# Patient Record
Sex: Male | Born: 2000 | Race: Black or African American | Hispanic: No | Marital: Single | Smoking: Current every day smoker
Health system: Southern US, Community
[De-identification: ages and names within clinical notes are randomized; demographics above are authoritative.]

---
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
## 2019-09-20 ENCOUNTER — Encounter (HOSPITAL_COMMUNITY): Payer: Self-pay

## 2019-09-20 ENCOUNTER — Other Ambulatory Visit: Payer: Self-pay

## 2019-09-20 ENCOUNTER — Emergency Department (HOSPITAL_COMMUNITY)
Admission: EM | Admit: 2019-09-20 | Discharge: 2019-09-20 | Disposition: A | Discharge status: Home / Self Care | Payer: Self-pay | Attending: Emergency Medicine | Admitting: Emergency Medicine

## 2019-09-20 DIAGNOSIS — R369 Urethral discharge, unspecified: Secondary | ICD-10-CM | POA: Insufficient documentation

## 2019-09-20 DIAGNOSIS — Z711 Person with feared health complaint in whom no diagnosis is made: Secondary | ICD-10-CM | POA: Insufficient documentation

## 2019-09-20 DIAGNOSIS — F17210 Nicotine dependence, cigarettes, uncomplicated: Secondary | ICD-10-CM | POA: Insufficient documentation

## 2019-09-20 LAB — URINALYSIS, ROUTINE W REFLEX MICROSCOPIC
Bacteria, UA: NONE SEEN
Bilirubin Urine: NEGATIVE
Glucose, UA: NEGATIVE mg/dL
Hgb urine dipstick: NEGATIVE
Ketones, ur: NEGATIVE mg/dL
Nitrite: NEGATIVE
Protein, ur: NEGATIVE mg/dL
Specific Gravity, Urine: 1.023 (ref 1.005–1.030)
WBC, UA: >50 WBC/hpf — ABNORMAL HIGH (ref 0–5)
pH: 7 (ref 5.0–8.0)

## 2019-09-20 LAB — HIV ANTIBODY (ROUTINE TESTING W REFLEX): HIV Screen 4th Generation wRfx: NONREACTIVE

## 2019-09-20 MED ORDER — CEFTRIAXONE SODIUM 500 MG IJ SOLR
500.0000 mg | Freq: Once | INTRAMUSCULAR | Status: AC
  Administered 2019-09-20: 500 mg via INTRAMUSCULAR
  Filled 2019-09-20: qty 500

## 2019-09-20 MED ORDER — DOXYCYCLINE HYCLATE 100 MG PO TABS
100.0000 mg | ORAL_TABLET | Freq: Once | ORAL | Status: AC
  Administered 2019-09-20: 19:00:00 100 mg via ORAL
  Filled 2019-09-20: qty 1

## 2019-09-20 MED ORDER — STERILE WATER FOR INJECTION IJ SOLN
INTRAMUSCULAR | Status: AC
  Administered 2019-09-20: 1 mL
  Filled 2019-09-20: qty 10

## 2019-09-20 MED ORDER — DOXYCYCLINE HYCLATE 100 MG PO CAPS: 100.0000 mg | ORAL_CAPSULE | Freq: Two times a day (BID) | ORAL | 0 refills | Status: DC

## 2019-09-20 NOTE — ED Triage Notes (Signed)
Patient complains of penile discharge x 4 days. Reports dysuria for same. NAD

## 2019-09-20 NOTE — Discharge Instructions (Signed)
Your STD testing is pending.  If positive you will need to let your partners know.  If your gonorrhea and Chlamydia testing is positive you were treated with antibiotic here in the emergency department as well as at home.  If your HIV and syphilis testing is positive you will need to follow-up outpatient

## 2019-09-20 NOTE — ED Provider Notes (Signed)
[facility] EMERGENCY DEPARTMENT Provider Note   CSN: [id number] Arrival date & time: 09/20/19  1625    History Chief Complaint  Patient presents with  . Penile Discharge   Raymond Mullen is a 19 y.o. male with no significant past medical history who presents for evaluation discharge.  Patient noticed yellow/green discharge to penis x1 day.  Last sexual intercourse 1 week ago.  Unsure if partner is having any symptoms.  No prior history of STDs.  Denies fever, chills, nausea, vomiting, abdominal pain, pain with bowel movements, testicular redness, swelling, warmth, rashes or lesions.  He is urinating without difficulty.  Denies aggravating or relieving factors  History obtained from patient and past medical record.  No interpreter is used.  HPI     History reviewed. No pertinent past medical history.  Patient Active Problem List   Diagnosis Date Noted  . Closed left ankle fracture 11/08/2016    Past Surgical History:  Procedure Laterality Date  . ORIF ANKLE FRACTURE Left 11/09/2016   Procedure: OPEN REDUCTION INTERNAL FIXATION (ORIF) ANKLE FRACTURE;  Surgeon: Renette Butters, MD;  Location: [facility];  Service: Orthopedics;  Laterality: Left;       No family history on file.  Social History   Tobacco Use  . Smoking status: Current Every Day Smoker  . Smokeless tobacco: Never Used  Substance Use Topics  . Alcohol use: No  . Drug use: Not Currently    Types: Marijuana    Home Medications Prior to Admission medications   Medication Sig Start Date End Date Taking? Authorizing Provider  doxycycline (VIBRAMYCIN) 100 MG capsule Take 1 capsule (100 mg total) by mouth 2 (two) times daily. 09/20/19   Deyja Sochacki A, PA-C    Allergies    Patient has no known allergies.  Review of Systems   Review of Systems  Constitutional: Negative.   HENT: Negative.   Respiratory: Negative.   Cardiovascular: Negative.   Gastrointestinal:  Negative.   Genitourinary: Positive for discharge and dysuria. Negative for decreased urine volume, difficulty urinating, flank pain, frequency, genital sores, hematuria, testicular pain and urgency.  Neurological: Negative.   All other systems reviewed and are negative.  Physical Exam Updated Vital Signs BP 132/84   Pulse 65   Temp 98.2 F (36.8 C) (Oral)   Resp 18   SpO2 100%   Physical Exam Vitals and nursing note reviewed. Exam conducted with a chaperone present.  Constitutional:      General: He is not in acute distress.    Appearance: He is well-developed. He is not ill-appearing, toxic-appearing or diaphoretic.  HENT:     Head: Normocephalic and atraumatic.     Nose: Nose normal.     Mouth/Throat:     Mouth: Mucous membranes are moist.     Pharynx: Oropharynx is clear.  Eyes:     Pupils: Pupils are equal, round, and reactive to light.  Cardiovascular:     Rate and Rhythm: Normal rate and regular rhythm.     Pulses: Normal pulses.     Heart sounds: Normal heart sounds.  Pulmonary:     Effort: Pulmonary effort is normal. No respiratory distress.     Breath sounds: Normal breath sounds.  Abdominal:     General: Bowel sounds are normal. There is no distension.     Palpations: Abdomen is soft.     Tenderness: There is no abdominal tenderness. There is no right CVA tenderness, left CVA tenderness, guarding  or rebound.     Hernia: No hernia is present.  Genitourinary:    Pubic Area: No rash or pubic lice.      Penis: Normal. No phimosis, paraphimosis, hypospadias, erythema, tenderness, discharge, swelling or lesions.      Testes: Normal. Cremasteric reflex is present.     Epididymis:     Right: Normal.     Left: Normal.     Comments: Yellow discharge to urethral meatus. No rashes or lesions. Musculoskeletal:        General: Normal range of motion.     Cervical back: Normal range of motion and neck supple.  Skin:    General: Skin is warm and dry.  Neurological:      Mental Status: He is alert.    ED Results / Procedures / Treatments   Labs (all labs ordered are listed, but only abnormal results are displayed) Labs Reviewed  URINALYSIS, ROUTINE W REFLEX MICROSCOPIC  RPR  HIV ANTIBODY (ROUTINE TESTING W REFLEX)  GC/CHLAMYDIA PROBE AMP ([place]) NOT AT [facility]    EKG None  Radiology No results found.  Procedures Procedures (including critical care time)  Medications Ordered in ED Medications  cefTRIAXone (ROCEPHIN) injection 500 mg (500 mg Intramuscular Given 09/20/19 1901)  doxycycline (VIBRA-TABS) tablet 100 mg (100 mg Oral Given 09/20/19 1900)  sterile water (preservative free) injection (1 mL  Given 09/20/19 1904)    ED Course  I have reviewed the triage vital signs and the nursing notes.  Pertinent labs & imaging results that were available during my care of the patient were reviewed by me and considered in my medical decision making (see chart for details).  Patient is afebrile without abdominal tenderness, abdominal pain or painful bowel movements to indicate prostatitis.  No tenderness to palpation of the testes or epididymis to suggest orchitis or epididymitis.  STD cultures obtained including HIV, syphilis, gonorrhea and chlamydia. Patient to be discharged with instructions to follow up with PCP. Discussed importance of using protection when sexually active. Pt understands that they have GC/Chlamydia cultures pending and that they will need to inform all sexual partners if results return positive. Patient has been treated prophylactically with Doxy and Rocephin.   Patient does not meet the SIRS or Sepsis criteria.  On repeat exam patient does not have a surgical abdomin and there are no peritoneal signs.  No indication of appendicitis, bowel obstruction, bowel perforation, cholecystitis, diverticulitis, renal stone. Patient discharged home with symptomatic treatment and given strict instructions for follow-up with their primary care  physician.  I have also discussed reasons to return immediately to the ER.  Patient expresses understanding and agrees with plan.  Patient does not wait for his UA results.  He will be DC home with Doxy.  Follow-up with results outpatient.  The patient has been appropriately medically screened and/or stabilized in the ED. I have low suspicion for any other emergent medical condition which would require further screening, evaluation or treatment in the ED or require inpatient management.  MDM Rules/Calculators/A&P                       Final Clinical Impression(s) / ED Diagnoses Final diagnoses:  Penile discharge  Concern about STD in male without diagnosis    Rx / DC Orders ED Discharge Orders         Ordered    doxycycline (VIBRAMYCIN) 100 MG capsule  2 times daily     09/20/19 1857  Ashyla Luth A, PA-C 09/20/19 1958    Blane Ohara, MD 09/21/19 [phone]

## 2019-09-21 LAB — RPR: RPR Ser Ql: NONREACTIVE

## 2019-09-22 LAB — GC/CHLAMYDIA PROBE AMP (~~LOC~~) NOT AT ARMC
Chlamydia: NEGATIVE
Comment: NEGATIVE
Comment: NORMAL
Neisseria Gonorrhea: POSITIVE — AB

## 2019-10-07 ENCOUNTER — Emergency Department (HOSPITAL_COMMUNITY): Payer: Self-pay

## 2019-10-07 ENCOUNTER — Encounter (HOSPITAL_COMMUNITY): Payer: Self-pay | Admitting: Emergency Medicine

## 2019-10-07 ENCOUNTER — Emergency Department (HOSPITAL_COMMUNITY)
Admission: EM | Admit: 2019-10-07 | Discharge: 2019-10-07 | Disposition: A | Discharge status: Home / Self Care | Payer: Self-pay | Attending: Emergency Medicine | Admitting: Emergency Medicine

## 2019-10-07 ENCOUNTER — Other Ambulatory Visit: Payer: Self-pay

## 2019-10-07 DIAGNOSIS — M25572 Pain in left ankle and joints of left foot: Secondary | ICD-10-CM | POA: Insufficient documentation

## 2019-10-07 DIAGNOSIS — G8929 Other chronic pain: Secondary | ICD-10-CM | POA: Insufficient documentation

## 2019-10-07 MED ORDER — ACETAMINOPHEN 325 MG PO TABS
650.0000 mg | ORAL_TABLET | Freq: Once | ORAL | Status: AC
  Administered 2019-10-07: 19:00:00 650 mg via ORAL
  Filled 2019-10-07: qty 2

## 2019-10-07 NOTE — ED Provider Notes (Signed)
Received patient as a handoff at shift change from [employer], PA-C.  Patient is 2 years s/p ORIF left ankle and presents for persistent ankle discomfort and swelling subsequent to surgery.  He states that his discomfort is predominantly on lateral malleolus and is intermittent.  Denies any new trauma.  No focal neurologic deficits.  Physical exam is entirely benign.  Plan per handoff provider was discharged with outpatient follow-up with orthopedic surgeon, Dr. Eulah Pont.  Recommendations for NSAIDs and Tylenol as needed for symptoms of pain and discomfort.  I personally reviewed plain films obtained of left ankle which demonstrate no acute osseous abnormalities that may explain patient's symptoms.  Hardware is in place and undisturbed.  Patient to follow-up outpatient with orthopedic surgeon.  Strict ED return precautions discussed with the patient.  Patient voices understanding and is agreeable to the plan.     DG Ankle Complete Left  Result Date: 10/07/2019 CLINICAL DATA:  Left ankle pain with standing.  No recent injury. EXAM: LEFT ANKLE COMPLETE - 3+ VIEW COMPARISON:  Plain films left ankle 10/19/2016. FINDINGS: There is no acute bony or joint abnormality. The patient has a healed distal fibular fracture in anatomic position and alignment with fixation hardware in place. No hardware complication is identified. Soft tissues are normal. IMPRESSION: No acute abnormality or finding to explain the patient's symptoms. Healed distal fibular fracture with fixation hardware in place. Electronically Signed   By: Drusilla Kanner M.D.   On: 10/07/2019 19:41      Lorelee New, PA-C 10/07/19 1953    Terald Sleeper, MD 10/08/19 [phone]

## 2019-10-07 NOTE — ED Triage Notes (Signed)
Pt c/o left ankle pain when standing. Denies injury/trauma.

## 2019-10-07 NOTE — Discharge Instructions (Signed)
You have been seen today for ankle pain. Please read and follow all provided instructions. Return to the emergency room for worsening condition or new concerning symptoms.    1. Medications:  Recommend tylenol and ibuprofen for pain as needed.   Continue usual home medications Take medications as prescribed. Please review all of the medicines and only take them if you do not have an allergy to them.   2. Treatment: rest, drink plenty of fluids. If you notice swelling in your leg you  should elevate it.  3. Follow Up:  Please follow up with the orthopedist Dr. Eulah Pont who operated on your ankle in 2018. Call the office to schedule follow up appointment   ?

## 2019-10-07 NOTE — ED Provider Notes (Signed)
[facility] EMERGENCY DEPARTMENT Provider Note   CSN: [id number] Arrival date & time: 10/07/19  1715     History Chief Complaint  Patient presents with  . Ankle Pain    Raymond Mullen is a 19 y.o. male with past medical history significant for ORIF left ankle in 2018 presents to emergency department today with chief complaint of left ankle pain and swelling x2 years.  Patient states he notices the pain and swelling when he stands on his feet for long periods of time.  He describes the pain as an aching and throbbing sensation.  Pain is located on the outside of his left ankle and radiates up his left leg.  He states the pain is intermittent.  He rates the pain 6 out of 10 in severity.  Has not taken any medications for symptoms prior to arrival.  He denies any new injury or trauma to the left ankle.  Also denies any fever, chills, numbness, tingling, decrease sensation or wound to left lower extremity.   History reviewed. No pertinent past medical history.  Patient Active Problem List   Diagnosis Date Noted  . Closed left ankle fracture 11/08/2016    Past Surgical History:  Procedure Laterality Date  . ORIF ANKLE FRACTURE Left 11/09/2016   Procedure: OPEN REDUCTION INTERNAL FIXATION (ORIF) ANKLE FRACTURE;  Surgeon: Sheral Apley, MD;  Location: [facility];  Service: Orthopedics;  Laterality: Left;       No family history on file.  Social History   Tobacco Use  . Smoking status: Current Every Day Smoker  . Smokeless tobacco: Never Used  Substance Use Topics  . Alcohol use: No  . Drug use: Not Currently    Types: Marijuana    Home Medications Prior to Admission medications   Medication Sig Start Date End Date Taking? Authorizing Provider  doxycycline (VIBRAMYCIN) 100 MG capsule Take 1 capsule (100 mg total) by mouth 2 (two) times daily. 09/20/19   Henderly, Britni A, PA-C    Allergies    Patient has no known allergies.  Review of  Systems   Review of Systems All other systems are reviewed and are negative for acute change except as noted in the HPI.  Physical Exam Updated Vital Signs BP 134/88 (BP Location: Right Arm)   Pulse 100   Temp 98.1 F (36.7 C) (Oral)   Resp 16   SpO2 96%   Physical Exam Vitals and nursing note reviewed.  Constitutional:      Appearance: He is well-developed. He is not ill-appearing or toxic-appearing.  HENT:     Head: Normocephalic and atraumatic.     Nose: Nose normal.  Eyes:     General: No scleral icterus.       Right eye: No discharge.        Left eye: No discharge.     Conjunctiva/sclera: Conjunctivae normal.  Neck:     Vascular: No JVD.  Cardiovascular:     Rate and Rhythm: Normal rate and regular rhythm.     Pulses: Normal pulses.          Dorsalis pedis pulses are 2+ on the right side and 2+ on the left side.     Heart sounds: Normal heart sounds.  Pulmonary:     Effort: Pulmonary effort is normal.     Breath sounds: Normal breath sounds.  Abdominal:     General: There is no distension.  Musculoskeletal:  General: Normal range of motion.     Cervical back: Normal range of motion.     Right lower leg: No edema.     Left lower leg: No edema.     Comments: Well healed surgical scar on lateral malleolus of left ankle. There is no swelling and tenderness over the lateral malleolus. No overt deformity. No tenderness over the medial aspect of the ankle. The fifth metatarsal is not tender. The ankle joint is intact without excessive opening on stressing. No break in skin. Good pedal pulse and cap refill of all toes. Wiggling toes without difficulty.  Full ROM of left hip and knee. Ambulates with steady gait.  Skin:    General: Skin is warm and dry.  Neurological:     Mental Status: He is oriented to person, place, and time.     GCS: GCS eye subscore is 4. GCS verbal subscore is 5. GCS motor subscore is 6.     Comments: Fluent speech, no facial droop.    Psychiatric:        Behavior: Behavior normal.      ED Results / Procedures / Treatments   Labs (all labs ordered are listed, but only abnormal results are displayed) Labs Reviewed - No data to display  EKG None  Radiology No results found.  Procedures Procedures (including critical care time)  Medications Ordered in ED Medications  acetaminophen (TYLENOL) tablet 650 mg (650 mg Oral Given 10/07/19 1845)    ED Course  I have reviewed the triage vital signs and the nursing notes.  Pertinent labs & imaging results that were available during my care of the patient were reviewed by me and considered in my medical decision making (see chart for details).    MDM Rules/Calculators/A&P                      History provided by patient with additional history obtained from chart review.    Patient presents to the ED with complaints of pain to the left ankle x 2 years. Exam without obvious deformity or open wounds. ROM intact. No tender to palpation of left ankle. No swelling noted. NVI distally.   Patient care transferred to G. Green PA-C  at the end of my shift pending xray. Patient presentation, ED course, and plan of care discussed with review of all pertinent labs and imaging. Please see his note for further details regarding further ED course and disposition. If x-ray is negative anticipate discharge home with recommendations of NSAIDs and Tylenol for pain as well as follow-up with orthopedic surgeon Dr. Percell Miller.   Portions of this note were generated with [employer]. Dictation errors may occur despite best attempts at proofreading.   Final Clinical Impression(s) / ED Diagnoses Final diagnoses:  Chronic pain of left ankle    Rx / DC Orders ED Discharge Orders    None       Flint Melter 10/07/19 1923    Lucrezia Starch, MD 10/08/19 2247

## 2019-10-07 NOTE — ED Notes (Signed)
Pt transported to Xray. 

## 2021-03-27 ENCOUNTER — Encounter (HOSPITAL_COMMUNITY): Payer: Self-pay | Admitting: Emergency Medicine

## 2021-03-27 ENCOUNTER — Other Ambulatory Visit: Payer: Self-pay

## 2021-03-27 ENCOUNTER — Emergency Department (HOSPITAL_COMMUNITY)
Admission: EM | Admit: 2021-03-27 | Discharge: 2021-03-27 | Disposition: A | Discharge status: Home / Self Care | Payer: Self-pay | Attending: Emergency Medicine | Admitting: Emergency Medicine

## 2021-03-27 DIAGNOSIS — N342 Other urethritis: Secondary | ICD-10-CM | POA: Insufficient documentation

## 2021-03-27 DIAGNOSIS — F17200 Nicotine dependence, unspecified, uncomplicated: Secondary | ICD-10-CM | POA: Insufficient documentation

## 2021-03-27 DIAGNOSIS — R300 Dysuria: Secondary | ICD-10-CM | POA: Insufficient documentation

## 2021-03-27 LAB — URINALYSIS, ROUTINE W REFLEX MICROSCOPIC
Bacteria, UA: NONE SEEN
Bilirubin Urine: NEGATIVE
Glucose, UA: NEGATIVE mg/dL
Hgb urine dipstick: NEGATIVE
Ketones, ur: NEGATIVE mg/dL
Nitrite: NEGATIVE
Protein, ur: NEGATIVE mg/dL
Specific Gravity, Urine: 1.023 (ref 1.005–1.030)
WBC, UA: >50 WBC/hpf — ABNORMAL HIGH (ref 0–5)
pH: 6 (ref 5.0–8.0)

## 2021-03-27 MED ORDER — STERILE WATER FOR INJECTION IJ SOLN
INTRAMUSCULAR | Status: AC
  Filled 2021-03-27: qty 10

## 2021-03-27 MED ORDER — DOXYCYCLINE HYCLATE 100 MG PO CAPS: 100.0000 mg | ORAL_CAPSULE | Freq: Two times a day (BID) | ORAL | 0 refills | Status: DC

## 2021-03-27 MED ORDER — CEFTRIAXONE SODIUM 500 MG IJ SOLR
500.0000 mg | Freq: Once | INTRAMUSCULAR | Status: AC
  Administered 2021-03-27: 13:00:00 500 mg via INTRAMUSCULAR
  Filled 2021-03-27: qty 500

## 2021-03-27 NOTE — Discharge Instructions (Signed)
Please read and follow all provided instructions.  Your diagnoses today include:  1. Urethritis     Tests performed today include: Test for gonorrhea and chlamydia.  Urine test: suggests urethral infection Vital signs. See below for your results today.   Medications:  For treatment of gonorrhea: You were treated with a rocephin (shot) today.   For treatment of chlamydia: You prescribed a prescription for doxycycline.  Home care instructions:  Read educational materials contained in this packet and follow any instructions provided.   You should tell your partners about your infection and avoid having sex for one week to allow time for the medicine to work.  Sexually transmitted disease testing also available at:  [facility] [place], [facility] [address], [place], phone [phone] or [phone]   Monday - Friday, call for an appointment  Return instructions:  Please return to the Emergency Department if you experience worsening symptoms.  Please return if you have any other emergent concerns.  Additional Information:  Your vital signs today were: BP 123/78 (BP Location: Right Arm)   Pulse 86   Temp 97.6 F (36.4 C) (Oral)   Resp 16   SpO2 98%  If your blood pressure (BP) was elevated above 135/85 this visit, please have this repeated by your doctor within one month. --------------

## 2021-03-27 NOTE — ED Triage Notes (Signed)
C/o green penile discharge and burning with urination since yesterday.

## 2021-03-27 NOTE — ED Provider Notes (Signed)
[facility] EMERGENCY DEPARTMENT Provider Note   CSN: [id number] Arrival date & time: 03/27/21  1057     History Chief Complaint  Patient presents with   Penile Discharge    Raymond Mullen is a 20 y.o. male.  Patient presents to the emergency department for evaluation of penile discharge and dysuria.  Symptoms started about 2 days ago.  He reports unprotected sexual intercourse with a partner about 2 weeks ago.  No sore throat or other symptoms reported.  No fevers or sores.  History of STI.        History reviewed. No pertinent past medical history.  Patient Active Problem List   Diagnosis Date Noted   Closed left ankle fracture 11/08/2016    Past Surgical History:  Procedure Laterality Date   ORIF ANKLE FRACTURE Left 11/09/2016   Procedure: OPEN REDUCTION INTERNAL FIXATION (ORIF) ANKLE FRACTURE;  Surgeon: Sheral Apley, MD;  Location: [facility];  Service: Orthopedics;  Laterality: Left;       No family history on file.  Social History   Tobacco Use   Smoking status: Every Day   Smokeless tobacco: Never  Substance Use Topics   Alcohol use: No   Drug use: Not Currently    Types: Marijuana    Home Medications Prior to Admission medications   Medication Sig Start Date End Date Taking? Authorizing Provider  doxycycline (VIBRAMYCIN) 100 MG capsule Take 1 capsule (100 mg total) by mouth 2 (two) times daily. 03/27/21  Yes Renne Crigler, PA-C    Allergies    Patient has no known allergies.  Review of Systems   Review of Systems  Constitutional:  Negative for fever.  HENT:  Negative for sore throat.   Eyes:  Negative for discharge.  Gastrointestinal:  Negative for rectal pain.  Genitourinary:  Positive for dysuria and penile discharge. Negative for frequency, genital sores, penile pain and testicular pain.  Musculoskeletal:  Negative for arthralgias.  Skin:  Negative for rash.  Hematological:  Negative for adenopathy.    Physical Exam Updated Vital Signs BP 123/78 (BP Location: Right Arm)   Pulse 86   Temp 97.6 F (36.4 C) (Oral)   Resp 16   SpO2 98%   Physical Exam Vitals and nursing note reviewed. Exam conducted with a chaperone present Chiropodist).  Constitutional:      Appearance: He is well-developed.  HENT:     Head: Normocephalic and atraumatic.  Eyes:     Conjunctiva/sclera: Conjunctivae normal.  Pulmonary:     Effort: No respiratory distress.  Genitourinary:    Penis: Discharge (Scant yellow) present.      Testes: Normal.  Musculoskeletal:     Cervical back: Normal range of motion and neck supple.  Skin:    General: Skin is warm and dry.  Neurological:     Mental Status: He is alert.    ED Results / Procedures / Treatments   Labs (all labs ordered are listed, but only abnormal results are displayed) Labs Reviewed  URINALYSIS, ROUTINE W REFLEX MICROSCOPIC - Abnormal; Notable for the following components:      Result Value   APPearance HAZY (*)    Leukocytes,Ua LARGE (*)    WBC, UA >50 (*)    All other components within normal limits  GC/CHLAMYDIA PROBE AMP ([place]) NOT AT [facility]    EKG None  Radiology No results found.  Procedures Procedures   Medications Ordered in ED Medications  cefTRIAXone (ROCEPHIN) injection  500 mg (500 mg Intramuscular Given 03/27/21 1241)  sterile water (preservative free) injection (  Given 03/27/21 1241)    ED Course  I have reviewed the triage vital signs and the nursing notes.  Pertinent labs & imaging results that were available during my care of the patient were reviewed by me and considered in my medical decision making (see chart for details).  Patient seen and examined.  Patient was placed in a hallway bed.  He agreed to be examined in the hallway restroom.  Chaperone present.  Discussed treatment for gonorrhea/chlamydia.  Offered and discussed doxycycline versus azithromycin.  Patient states that he "does not care"  when he gets treated with.  Prescription given for doxycycline due to increased efficacy.  Vital signs reviewed and are as follows: BP 123/78 (BP Location: Right Arm)   Pulse 86   Temp 97.6 F (36.4 C) (Oral)   Resp 16   SpO2 98%  12:55 PM   Will test and treat for STD exposure. Patient counseled on safe sexual practices. Told them that they should not have sexual contact for next 7 days and that they need to inform sexual partners so that they can get tested and treated as well. Patient verbalizes understanding and agrees with plan.       MDM Rules/Calculators/A&P                           Patient here with signs and symptoms consistent with urethritis.  Tested for gonorrhea and chlamydia.  Treatment as above.   Final Clinical Impression(s) / ED Diagnoses Final diagnoses:  Urethritis    Rx / DC Orders ED Discharge Orders          Ordered    doxycycline (VIBRAMYCIN) 100 MG capsule  2 times daily        03/27/21 1233             Renne Crigler, PA-C 03/27/21 1256    Derwood Kaplan, MD 03/29/21 1728

## 2021-03-27 NOTE — ED Provider Notes (Signed)
Emergency Medicine Provider Triage Evaluation Note  JAEVON PARAS , a 20 y.o. male  was evaluated in triage.  Pt complains of penile discharge, pain with urination. Unprotected sex with partner without known STD status ~2 weeks ago. Denies anal or oral sex. No fevers / chills.  Review of Systems  Positive: Penile discharge Negative: No testicle pain  Physical Exam  BP 123/78 (BP Location: Right Arm)   Pulse 86   Temp 97.6 F (36.4 C) (Oral)   Resp 16   SpO2 98%  Gen:   Awake, no distress   Resp:  Normal effort  MSK:   Moves extremities without difficulty  Other:  No testicular deformity, pain or swelling, yellowish discharge was expressed from tip of penis  Medical Decision Making  Medically screening exam initiated at 11:30 AM.  Appropriate orders placed.  KYAIR DITOMMASO was informed that the remainder of the evaluation will be completed by another provider, this initial triage assessment does not replace that evaluation, and the importance of remaining in the ED until their evaluation is complete.  STD, penile discharge   [place] 03/27/21 1131    Tegeler, Canary Brim, MD 03/27/21 1400

## 2021-03-28 LAB — GC/CHLAMYDIA PROBE AMP (~~LOC~~) NOT AT ARMC
Chlamydia: NEGATIVE
Comment: NEGATIVE
Comment: NORMAL
Neisseria Gonorrhea: POSITIVE — AB

## 2022-03-24 IMAGING — DX DG ANKLE COMPLETE 3+V*L*
3 series · 3 of 3 positions shown · non-contrast
Comparison: Plain films left ankle 10/19/2016.

CLINICAL DATA: Left ankle pain with standing.  No recent injury.

EXAM:
LEFT ANKLE COMPLETE - 3+ VIEW

[x ankle ap left]
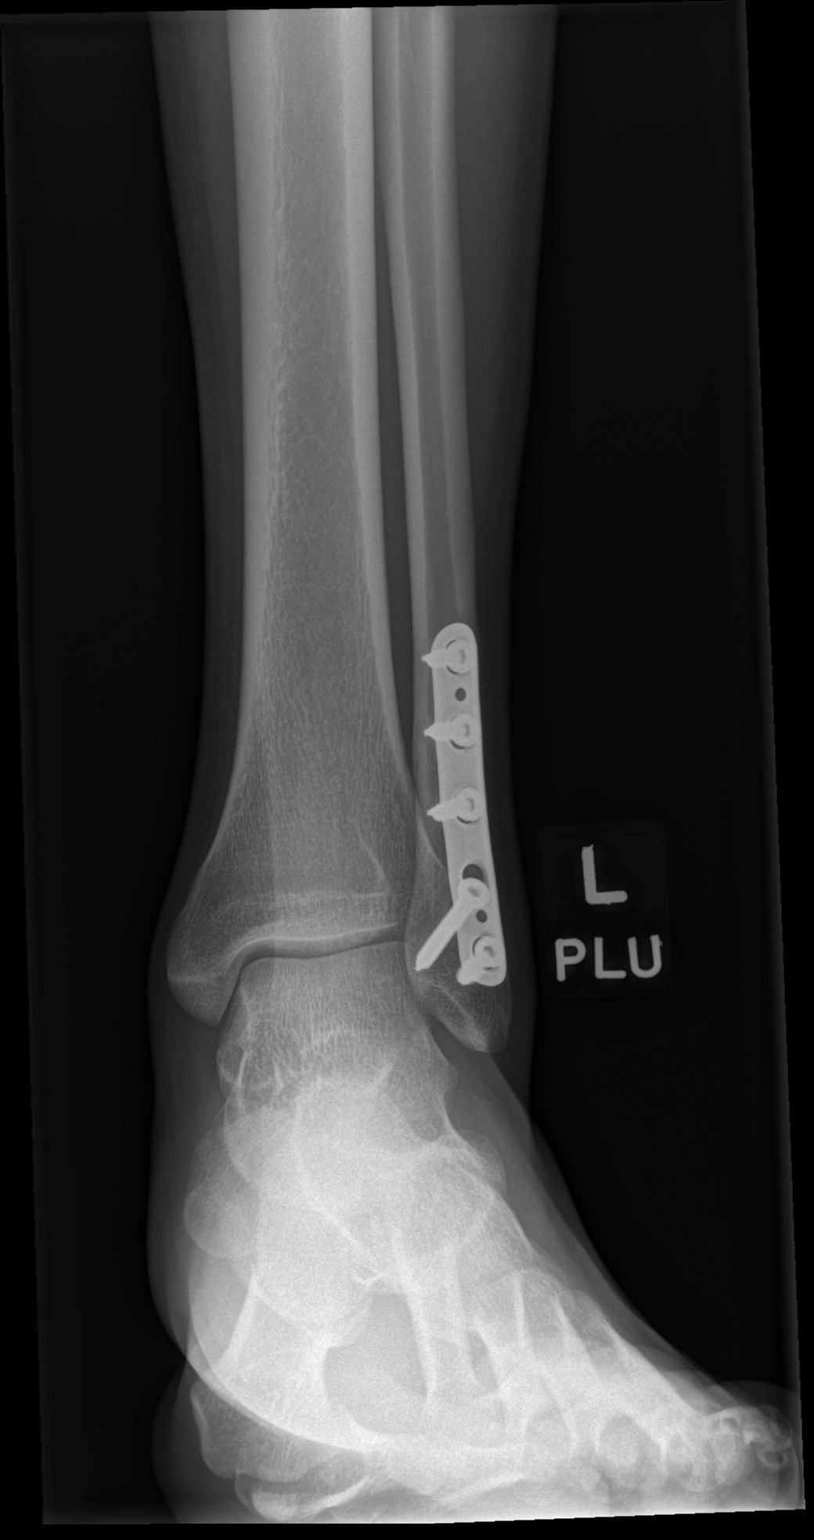

[x ankle obl left]
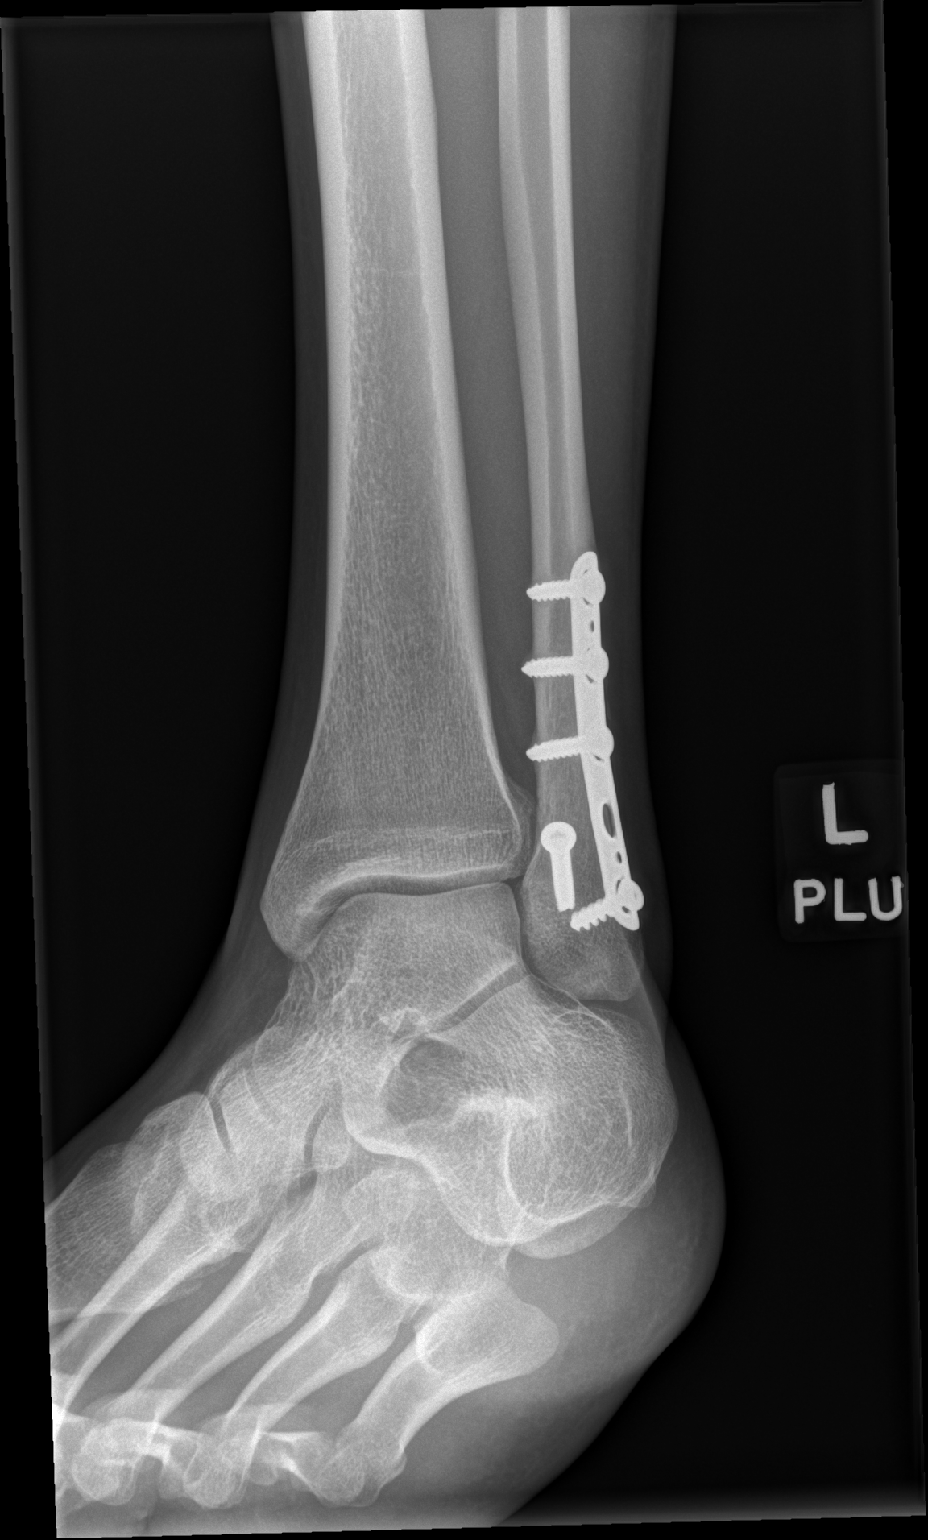

[x ankle lat left]
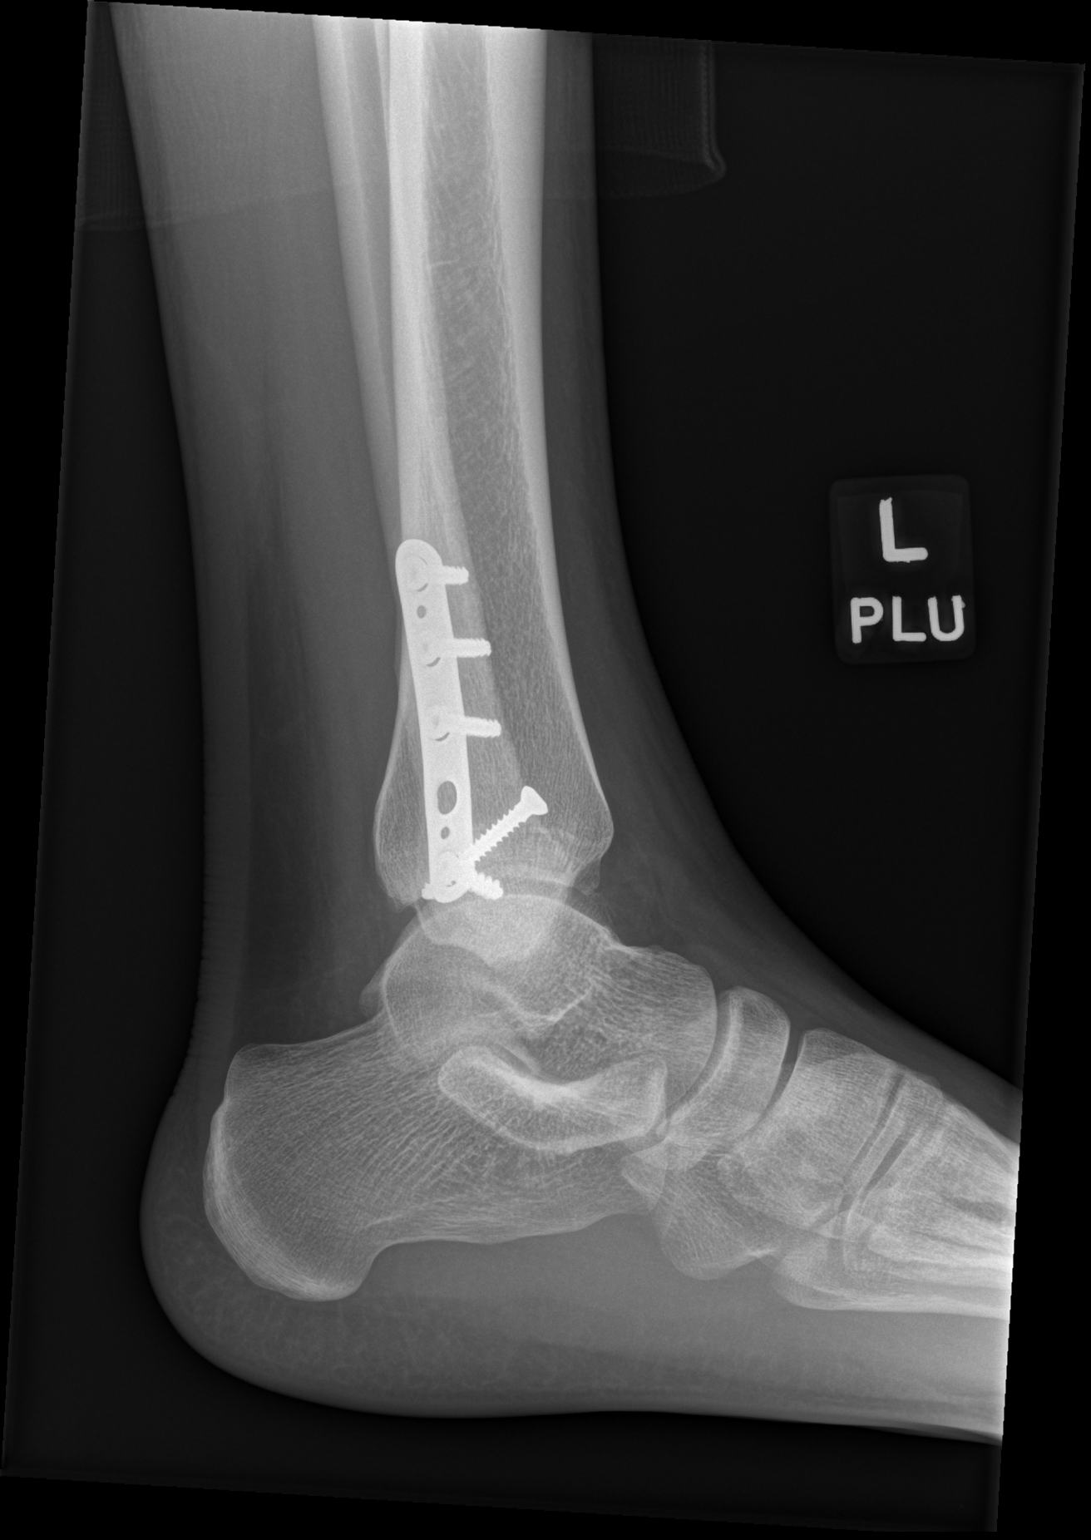

[3 of 3 positions shown; findings below may reference images not displayed]

FINDINGS: There is no acute bony or joint abnormality. The patient has a
healed distal fibular fracture in anatomic position and alignment
with fixation hardware in place. No hardware complication is
identified. Soft tissues are normal.
IMPRESSION: No acute abnormality or finding to explain the patient's symptoms.

Healed distal fibular fracture with fixation hardware in place.

## 2022-03-31 ENCOUNTER — Encounter (HOSPITAL_COMMUNITY): Payer: Self-pay

## 2022-03-31 ENCOUNTER — Other Ambulatory Visit: Payer: Self-pay

## 2022-03-31 ENCOUNTER — Emergency Department (HOSPITAL_COMMUNITY)
Admission: EM | Admit: 2022-03-31 | Discharge: 2022-03-31 | Disposition: A | Discharge status: Home / Self Care | Payer: Self-pay | Attending: Medical | Admitting: Medical

## 2022-03-31 DIAGNOSIS — Z113 Encounter for screening for infections with a predominantly sexual mode of transmission: Secondary | ICD-10-CM | POA: Insufficient documentation

## 2022-03-31 DIAGNOSIS — R369 Urethral discharge, unspecified: Secondary | ICD-10-CM | POA: Insufficient documentation

## 2022-03-31 LAB — URINALYSIS, ROUTINE W REFLEX MICROSCOPIC
Bilirubin Urine: NEGATIVE
Glucose, UA: NEGATIVE mg/dL
Hgb urine dipstick: NEGATIVE
Ketones, ur: NEGATIVE mg/dL
Nitrite: NEGATIVE
Protein, ur: NEGATIVE mg/dL
Specific Gravity, Urine: 1.021 (ref 1.005–1.030)
pH: 7 (ref 5.0–8.0)

## 2022-03-31 MED ORDER — DOXYCYCLINE HYCLATE 100 MG PO TABS
100.0000 mg | ORAL_TABLET | Freq: Once | ORAL | Status: AC
  Administered 2022-03-31: 100 mg via ORAL
  Filled 2022-03-31: qty 1

## 2022-03-31 MED ORDER — DOXYCYCLINE HYCLATE 100 MG PO CAPS: 100.0000 mg | ORAL_CAPSULE | Freq: Two times a day (BID) | ORAL | 0 refills | Status: DC

## 2022-03-31 MED ORDER — LIDOCAINE HCL (PF) 1 % IJ SOLN
1.0000 mL | Freq: Once | INTRAMUSCULAR | Status: AC
  Administered 2022-03-31: 2 mL
  Filled 2022-03-31: qty 5

## 2022-03-31 MED ORDER — CEFTRIAXONE SODIUM 500 MG IJ SOLR
500.0000 mg | Freq: Once | INTRAMUSCULAR | Status: AC
  Administered 2022-03-31: 500 mg via INTRAMUSCULAR
  Filled 2022-03-31: qty 500

## 2022-03-31 NOTE — ED Provider Triage Note (Signed)
Emergency Medicine Provider Triage Evaluation Note  ZAKARIYYA HELFMAN , a 21 y.o. male  was evaluated in triage.  Pt complains of green penile dischargex2 days. Reports dysuria. No urinary frequency or urgency. No fever, chills.   Review of Systems  Positive: penile discharge  Negative: rectal pain, testicular pain  Physical Exam  BP 119/67 (BP Location: Right Arm)   Pulse 81   Temp 98.4 F (36.9 C) (Oral)   Resp 18   Ht 5\' 7"  (1.702 m)   Wt 57.6 kg   SpO2 98%   BMI 19.89 kg/m  Gen:   Awake, no distress   Resp:  Normal effort  MSK:   Moves extremities without difficulty   Medical Decision Making  Medically screening exam initiated at 2:37 PM.  Appropriate orders placed.  KEIMON BASALDUA was informed that the remainder of the evaluation will be completed by another provider, this initial triage assessment does not replace that evaluation, and the importance of remaining in the ED until their evaluation is complete.   Osvaldo Shipper, [place] 03/31/22 1440

## 2022-03-31 NOTE — ED Triage Notes (Signed)
Pt arrived POV from home c/o some penile discomfort and greenish discharge x2 days.

## 2022-03-31 NOTE — Discharge Instructions (Addendum)
Please follow-up with your primary care doctor.  Take your doxycycline as prescribed.  Do not have sex until you complete your course, you will receive a call if he does positive for gonorrhea or chlamydia.  You have declined HIV and syphilis testing today, please follow-up with your PCP.

## 2022-03-31 NOTE — ED Provider Notes (Signed)
[facility] Provider Note   CSN: [id number] Arrival date & time: 03/31/22  1319     History  Chief Complaint  Patient presents with   Penile Discharge    Raymond Mullen is a 21 y.o. male, who presents to the ED secondary to green discharge for the last 2 days.  Reports dysuria.  No urinary frequency or urgency.  Denies any fevers or chills last sexually active 2 months ago.  Declines HIV syphilis testing.       Home Medications Prior to Admission medications   Medication Sig Start Date End Date Taking? Authorizing Provider  doxycycline (VIBRAMYCIN) 100 MG capsule Take 1 capsule (100 mg total) by mouth 2 (two) times daily. 03/31/22  Yes Marisa Hage L, PA      Allergies    Patient has no known allergies.    Review of Systems   Review of Systems  Constitutional:  Negative for chills and fever.  Genitourinary:  Positive for dysuria and penile discharge. Negative for flank pain, frequency and genital sores.    Physical Exam Updated Vital Signs BP 119/67 (BP Location: Right Arm)   Pulse 81   Temp 98.4 F (36.9 C) (Oral)   Resp 18   Ht 5\' 7"  (1.702 m)   Wt 57.6 kg   SpO2 98%   BMI 19.89 kg/m  Physical Exam Vitals and nursing note reviewed.  Constitutional:      General: He is not in acute distress.    Appearance: He is well-developed.  HENT:     Head: Normocephalic and atraumatic.  Eyes:     Conjunctiva/sclera: Conjunctivae normal.  Cardiovascular:     Rate and Rhythm: Normal rate and regular rhythm.     Heart sounds: No murmur heard. Pulmonary:     Effort: Pulmonary effort is normal. No respiratory distress.     Breath sounds: Normal breath sounds.  Abdominal:     Palpations: Abdomen is soft.     Tenderness: There is no abdominal tenderness. There is no right CVA tenderness or left CVA tenderness.  Genitourinary:    Comments: Pt declined Musculoskeletal:        General: No swelling.     Cervical back: Neck supple.   Skin:    General: Skin is warm and dry.     Capillary Refill: Capillary refill takes less than 2 seconds.  Neurological:     Mental Status: He is alert.  Psychiatric:        Mood and Affect: Mood normal.     ED Results / Procedures / Treatments   Labs (all labs ordered are listed, but only abnormal results are displayed) Labs Reviewed  URINALYSIS, ROUTINE W REFLEX MICROSCOPIC  GC/CHLAMYDIA PROBE AMP ([place]) NOT AT [facility]    EKG None  Radiology No results found.  Procedures Procedures   Medications Ordered in ED Medications  cefTRIAXone (ROCEPHIN) injection 500 mg (has no administration in time range)  lidocaine (PF) (XYLOCAINE) 1 % injection 1-2.1 mL (has no administration in time range)  doxycycline (VIBRA-TABS) tablet 100 mg (has no administration in time range)    ED Course/ Medical Decision Making/ A&P                           Medical Decision Making Amount and/or Complexity of Data Reviewed Labs: ordered.  Risk Prescription drug management.   Final Clinical Impression(s) / ED Diagnoses Final diagnoses:  Penile discharge  Screening examination for STD (sexually transmitted disease)    Rx / DC Orders ED Discharge Orders          Ordered    doxycycline (VIBRAMYCIN) 100 MG capsule  2 times daily        03/31/22 1443              Clarabell Matsuoka, [place], [place] 03/31/22 1448    Wynetta Fines, MD 04/01/22 1308

## 2023-11-22 ENCOUNTER — Emergency Department (HOSPITAL_COMMUNITY)

## 2023-11-22 ENCOUNTER — Encounter (HOSPITAL_COMMUNITY): Payer: Self-pay

## 2023-11-22 ENCOUNTER — Other Ambulatory Visit: Payer: Self-pay

## 2023-11-22 ENCOUNTER — Emergency Department (HOSPITAL_COMMUNITY)
Admission: EM | Admit: 2023-11-22 | Discharge: 2023-11-22 | Disposition: A | Discharge status: Home / Self Care | Attending: Emergency Medicine | Admitting: Emergency Medicine

## 2023-11-22 DIAGNOSIS — W2209XA Striking against other stationary object, initial encounter: Secondary | ICD-10-CM | POA: Insufficient documentation

## 2023-11-22 DIAGNOSIS — S6991XA Unspecified injury of right wrist, hand and finger(s), initial encounter: Secondary | ICD-10-CM | POA: Diagnosis present

## 2023-11-22 DIAGNOSIS — S62344A Nondisplaced fracture of base of fourth metacarpal bone, right hand, initial encounter for closed fracture: Secondary | ICD-10-CM | POA: Insufficient documentation

## 2023-11-22 MED ORDER — HYDROCODONE-ACETAMINOPHEN 5-325 MG PO TABS
1.0000 | ORAL_TABLET | Freq: Once | ORAL | Status: AC
  Administered 2023-11-22: 1 via ORAL
  Filled 2023-11-22: qty 1

## 2023-11-22 MED ORDER — HYDROCODONE-ACETAMINOPHEN 5-325 MG PO TABS: 1.0000 | ORAL_TABLET | Freq: Four times a day (QID) | ORAL | 0 refills | Status: DC | PRN

## 2023-11-22 NOTE — ED Triage Notes (Signed)
 Pt here for right hand pain that started last Sunday. Pt states it was swollen and is getting better. Denies recent trauma. Pt states he punched a wall with that hand.

## 2023-11-22 NOTE — ED Provider Notes (Signed)
 [facility] EMERGENCY DEPARTMENT AT [facility] Provider Note   CSN: [id number] Arrival date & time: 11/22/23  9058     Patient presents with: Hand Pain   Raymond Mullen is a 23 y.o. male.   HPI   22 year old right-hand-dominant male presents emergency department with right hand pain.  Patient states couple days ago on Sunday he punched a wall.  Since then he has been having pain at that site.  Was not evaluated initially.  Denies any numbness or tingling.  No other injury admitted.  Prior to Admission medications   Medication Sig Start Date End Date Taking? Authorizing Provider  HYDROcodone -acetaminophen  (NORCO/VICODIN) 5-325 MG tablet Take 1 tablet by mouth every 6 (six) hours as needed. 11/22/23  Yes Ingram Onnen, Roxie HERO, DO  doxycycline  (VIBRAMYCIN ) 100 MG capsule Take 1 capsule (100 mg total) by mouth 2 (two) times daily. 03/31/22   Small, Brooke L, PA    Allergies: Patient has no known allergies.    Review of Systems  Respiratory:  Negative for shortness of breath.   Cardiovascular:  Negative for chest pain.  Musculoskeletal:        + Right hand pain  Neurological:  Negative for weakness and numbness.    Updated Vital Signs BP 135/89 (BP Location: Right Arm)   Pulse 99   Temp 98.5 F (36.9 C)   Resp 16   Ht 5' 8 (1.727 m)   Wt 58.1 kg   SpO2 98%   BMI 19.46 kg/m   Physical Exam Vitals and nursing note reviewed.  Constitutional:      General: He is not in acute distress.    Appearance: Normal appearance.  HENT:     Head: Normocephalic.     Mouth/Throat:     Mouth: Mucous membranes are moist.   Cardiovascular:     Rate and Rhythm: Normal rate.  Pulmonary:     Effort: Pulmonary effort is normal. No respiratory distress.   Musculoskeletal:     Comments: Mild edema at the base of the metacarpals with mild tenderness to palpation, the hand is otherwise neurovascularly intact, forearm and elbow unremarkable.   Skin:    General: Skin is warm.    Neurological:     Mental Status: He is alert and oriented to person, place, and time. Mental status is at baseline.   Psychiatric:        Mood and Affect: Mood normal.     (all labs ordered are listed, but only abnormal results are displayed) Labs Reviewed - No data to display  EKG: None  Radiology: DG Hand Complete Right Result Date: 11/22/2023 CLINICAL DATA:  Pain and swelling.?  Fall. EXAM: RIGHT HAND - COMPLETE 3+ VIEW COMPARISON:  None Available. FINDINGS: There is age indeterminate mildly displaced fracture of the base of the fourth metacarpal with intra-articular extension. However, the fracture lines do not appear sharp on these views and therefore findings may represent subacute versus acute fracture. Correlate clinically with history and for point tenderness. No other acute fracture or dislocation. No aggressive osseous lesion. No significant arthritis of imaged joints. No radiopaque foreign bodies. Soft tissues are within normal limits. IMPRESSION: Age indeterminate mildly displaced fracture of the base of the fourth metacarpal with intra-articular extension. Electronically Signed   By: Ree Molt M.D.   On: 11/22/2023 10:31     Procedures   Medications Ordered in the ED  HYDROcodone -acetaminophen  (NORCO/VICODIN) 5-325 MG per tablet 1 tablet (has no administration in time range)  Medical Decision Making Amount and/or Complexity of Data Reviewed Radiology: ordered.  Risk Prescription drug management.   23 year old male presents emergency department after punching a wall couple days ago on Sunday.  Now having right hand pain.  He is right-hand dominant.  The hand is neurovascularly intact.  The x-ray identifies an age-indeterminate fracture at the base of the fourth metacarpal, extending intra-articular.  This is most likely corresponding to the admitted injury a couple days ago.  Will place the hand in a short arm volar splint  and refer outpatient to hand surgery.  Will provide pain control.  Patient at this time appears safe and stable for discharge and close outpatient follow up. Discharge plan and strict return to ED precautions discussed, patient verbalizes understanding and agreement.      Final diagnoses:  Closed nondisplaced fracture of base of fourth metacarpal bone of right hand, initial encounter    ED Discharge Orders          Ordered    HYDROcodone-acetaminophen (NORCO/VICODIN) 5-325 MG tablet  Every 6 hours PRN        06 /26/25 1212               Ivee Poellnitz, Roxie HERO, DO 11/22/23 1215

## 2023-11-22 NOTE — ED Notes (Signed)
 Ortho tech made aware of the need for short arm splint.

## 2023-11-22 NOTE — Discharge Instructions (Signed)
 You have been seen and discharged from the emergency department.  You were found to have a fracture at the base of the fourth metacarpal.  This is most likely from the injury a couple days ago.  Keep splint in place.  Take pain medicine as needed.  Do not mix this medication with alcohol or other sedating medications. Do not drive or do heavy physical activity until you know how this medication affects you.  It may cause drowsiness.  Schedule an appointment with orthopedic hand doctor for reevaluation and splint removal.  Follow-up with your primary provider for further evaluation and further care. Take home medications as prescribed. If you have any worsening symptoms or further concerns for your health please return to an emergency department for further evaluation.

## 2023-11-22 NOTE — Progress Notes (Signed)
 Orthopedic Tech Progress Note Patient Details:  Raymond Mullen 17-Apr-2001 [id number]  Well-padded volar splint placed to RUE. Ice and elevation were encouraged to help with any discomfort/swelling.  Ortho Devices Type of Ortho Device: Volar splint Ortho Device/Splint Location: RUE Ortho Device/Splint Interventions: Ordered, Application, Adjustment   Post Interventions Patient Tolerated: Fair Instructions Provided: Care of device, Adjustment of device  Raymond Mullen 11/22/2023, 11:53 AM

## 2023-11-28 ENCOUNTER — Other Ambulatory Visit: Payer: Self-pay

## 2023-11-28 ENCOUNTER — Encounter (HOSPITAL_BASED_OUTPATIENT_CLINIC_OR_DEPARTMENT_OTHER): Payer: Self-pay | Admitting: Orthopedic Surgery

## 2023-11-28 ENCOUNTER — Other Ambulatory Visit: Payer: Self-pay | Admitting: Orthopedic Surgery

## 2023-11-29 ENCOUNTER — Ambulatory Visit (HOSPITAL_BASED_OUTPATIENT_CLINIC_OR_DEPARTMENT_OTHER): Admitting: Anesthesiology

## 2023-11-29 ENCOUNTER — Other Ambulatory Visit: Payer: Self-pay

## 2023-11-29 ENCOUNTER — Encounter (HOSPITAL_BASED_OUTPATIENT_CLINIC_OR_DEPARTMENT_OTHER)
Admission: RE | Disposition: A | Discharge status: Home / Self Care | Payer: Self-pay | Source: Home / Self Care | Attending: Orthopedic Surgery

## 2023-11-29 ENCOUNTER — Ambulatory Visit (HOSPITAL_BASED_OUTPATIENT_CLINIC_OR_DEPARTMENT_OTHER)
Admission: RE | Admit: 2023-11-29 | Discharge: 2023-11-29 | Disposition: A | Discharge status: Home / Self Care | Attending: Orthopedic Surgery | Admitting: Orthopedic Surgery

## 2023-11-29 ENCOUNTER — Encounter (HOSPITAL_BASED_OUTPATIENT_CLINIC_OR_DEPARTMENT_OTHER): Payer: Self-pay | Admitting: Orthopedic Surgery

## 2023-11-29 DIAGNOSIS — W228XXA Striking against or struck by other objects, initial encounter: Secondary | ICD-10-CM | POA: Insufficient documentation

## 2023-11-29 DIAGNOSIS — F17200 Nicotine dependence, unspecified, uncomplicated: Secondary | ICD-10-CM | POA: Diagnosis not present

## 2023-11-29 DIAGNOSIS — S63054A Dislocation of other carpometacarpal joint of right hand, initial encounter: Secondary | ICD-10-CM | POA: Diagnosis present

## 2023-11-29 DIAGNOSIS — S62314A Displaced fracture of base of fourth metacarpal bone, right hand, initial encounter for closed fracture: Secondary | ICD-10-CM | POA: Insufficient documentation

## 2023-11-29 HISTORY — PX: CLOSED REDUCTION FINGER WITH PERCUTANEOUS PINNING: SHX5612

## 2023-11-29 SURGERY — CLOSED REDUCTION, FINGER, WITH PERCUTANEOUS PINNING
Anesthesia: Monitor Anesthesia Care | Site: Finger | Laterality: Right

## 2023-11-29 MED ORDER — CEFAZOLIN SODIUM-DEXTROSE 2-4 GM/100ML-% IV SOLN
2.0000 g | INTRAVENOUS | Status: AC
  Administered 2023-11-29: 2 g via INTRAVENOUS

## 2023-11-29 MED ORDER — PROPOFOL 10 MG/ML IV BOLUS
INTRAVENOUS | Status: DC | PRN
  Administered 2023-11-29: 20 mg via INTRAVENOUS

## 2023-11-29 MED ORDER — MIDAZOLAM HCL 2 MG/2ML IJ SOLN
INTRAMUSCULAR | Status: AC
  Filled 2023-11-29: qty 2

## 2023-11-29 MED ORDER — FENTANYL CITRATE (PF) 100 MCG/2ML IJ SOLN
100.0000 ug | Freq: Once | INTRAMUSCULAR | Status: AC
  Administered 2023-11-29: 100 ug via INTRAVENOUS

## 2023-11-29 MED ORDER — HYDROCODONE-ACETAMINOPHEN 5-325 MG PO TABS: 1.0000 | ORAL_TABLET | Freq: Four times a day (QID) | ORAL | 0 refills | Status: AC | PRN

## 2023-11-29 MED ORDER — OXYCODONE HCL 5 MG/5ML PO SOLN: 5.0000 mg | Freq: Once | ORAL | Status: DC | PRN

## 2023-11-29 MED ORDER — LIDOCAINE 2% (20 MG/ML) 5 ML SYRINGE
INTRAMUSCULAR | Status: AC
  Filled 2023-11-29: qty 5

## 2023-11-29 MED ORDER — CEFAZOLIN SODIUM-DEXTROSE 2-4 GM/100ML-% IV SOLN
INTRAVENOUS | Status: AC
  Filled 2023-11-29: qty 100

## 2023-11-29 MED ORDER — FENTANYL CITRATE (PF) 100 MCG/2ML IJ SOLN: 25.0000 ug | INTRAMUSCULAR | Status: DC | PRN

## 2023-11-29 MED ORDER — LIDOCAINE 2% (20 MG/ML) 5 ML SYRINGE
INTRAMUSCULAR | Status: DC | PRN
  Administered 2023-11-29: 40 mg via INTRAVENOUS

## 2023-11-29 MED ORDER — PROPOFOL 500 MG/50ML IV EMUL
INTRAVENOUS | Status: DC | PRN
  Administered 2023-11-29: 125 ug/kg/min via INTRAVENOUS

## 2023-11-29 MED ORDER — ONDANSETRON HCL 4 MG/2ML IJ SOLN: 4.0000 mg | Freq: Four times a day (QID) | INTRAMUSCULAR | Status: DC | PRN

## 2023-11-29 MED ORDER — OXYCODONE HCL 5 MG PO TABS: 5.0000 mg | ORAL_TABLET | Freq: Once | ORAL | Status: DC | PRN

## 2023-11-29 MED ORDER — PROPOFOL 10 MG/ML IV BOLUS
INTRAVENOUS | Status: AC
  Filled 2023-11-29: qty 20

## 2023-11-29 MED ORDER — FENTANYL CITRATE (PF) 100 MCG/2ML IJ SOLN
INTRAMUSCULAR | Status: AC
Start: 2023-11-29 — End: 2023-11-29
  Filled 2023-11-29: qty 2

## 2023-11-29 MED ORDER — MIDAZOLAM HCL 2 MG/2ML IJ SOLN
2.0000 mg | Freq: Once | INTRAMUSCULAR | Status: AC
  Administered 2023-11-29: 2 mg via INTRAVENOUS

## 2023-11-29 MED ORDER — LACTATED RINGERS IV SOLN: INTRAVENOUS | Status: DC

## 2023-11-29 MED ORDER — ONDANSETRON HCL 4 MG/2ML IJ SOLN
INTRAMUSCULAR | Status: AC
Start: 2023-11-29 — End: 2023-11-29
  Filled 2023-11-29: qty 2

## 2023-11-29 MED ORDER — ONDANSETRON HCL 4 MG/2ML IJ SOLN
INTRAMUSCULAR | Status: DC | PRN
  Administered 2023-11-29: 4 mg via INTRAVENOUS

## 2023-11-29 SURGICAL SUPPLY — 38 items
BLADE SURG 15 STRL LF DISP TIS (BLADE) ×2 IMPLANT
BNDG ELASTIC 2INX 5YD STR LF (GAUZE/BANDAGES/DRESSINGS) IMPLANT
BNDG ELASTIC 3INX 5YD STR LF (GAUZE/BANDAGES/DRESSINGS) ×1 IMPLANT
BNDG ESMARK 4X9 LF (GAUZE/BANDAGES/DRESSINGS) ×1 IMPLANT
BNDG GAUZE DERMACEA FLUFF 4 (GAUZE/BANDAGES/DRESSINGS) ×1 IMPLANT
CHLORAPREP W/TINT 26 (MISCELLANEOUS) ×1 IMPLANT
CORD BIPOLAR FORCEPS 12FT (ELECTRODE) IMPLANT
COVER BACK TABLE 60X90IN (DRAPES) ×1 IMPLANT
COVER MAYO STAND STRL (DRAPES) ×1 IMPLANT
CUFF TOURN SGL QUICK 18X4 (TOURNIQUET CUFF) ×1 IMPLANT
DRAPE EXTREMITY T 121X128X90 (DISPOSABLE) ×1 IMPLANT
DRAPE OEC MINIVIEW 54X84 (DRAPES) ×1 IMPLANT
DRAPE SURG 17X23 STRL (DRAPES) ×1 IMPLANT
GAUZE SPONGE 4X4 12PLY STRL (GAUZE/BANDAGES/DRESSINGS) ×1 IMPLANT
GAUZE XEROFORM 1X8 LF (GAUZE/BANDAGES/DRESSINGS) ×1 IMPLANT
GLOVE BIO SURGEON STRL SZ7.5 (GLOVE) ×1 IMPLANT
GLOVE BIOGEL PI IND STRL 7.0 (GLOVE) IMPLANT
GLOVE BIOGEL PI IND STRL 8 (GLOVE) ×1 IMPLANT
GLOVE BIOGEL PI IND STRL 8.5 (GLOVE) IMPLANT
GLOVE SURG ORTHO 8.0 STRL STRW (GLOVE) IMPLANT
GOWN STRL REUS W/ TWL LRG LVL3 (GOWN DISPOSABLE) ×1 IMPLANT
GOWN STRL REUS W/TWL XL LVL3 (GOWN DISPOSABLE) IMPLANT
KWIRE DBL .045X4 NSTRL (WIRE) IMPLANT
NDL HYPO 25X1 1.5 SAFETY (NEEDLE) IMPLANT
NEEDLE HYPO 25X1 1.5 SAFETY (NEEDLE) IMPLANT
NS IRRIG 1000ML POUR BTL (IV SOLUTION) ×1 IMPLANT
PACK BASIN DAY SURGERY FS (CUSTOM PROCEDURE TRAY) ×1 IMPLANT
PAD CAST 3X4 CTTN HI CHSV (CAST SUPPLIES) IMPLANT
PAD CAST 4YDX4 CTTN HI CHSV (CAST SUPPLIES) IMPLANT
SLEEVE SCD COMPRESS KNEE MED (STOCKING) IMPLANT
SPLINT PLASTER CAST XFAST 3X15 (CAST SUPPLIES) IMPLANT
STOCKINETTE 4X48 STRL (DRAPES) ×1 IMPLANT
SUT ETHILON 3 0 PS 1 (SUTURE) IMPLANT
SUT ETHILON 4 0 PS 2 18 (SUTURE) IMPLANT
SYR BULB EAR ULCER 3OZ GRN STR (SYRINGE) IMPLANT
SYR CONTROL 10ML LL (SYRINGE) IMPLANT
TOWEL GREEN STERILE FF (TOWEL DISPOSABLE) ×2 IMPLANT
UNDERPAD 30X36 HEAVY ABSORB (UNDERPADS AND DIAPERS) ×1 IMPLANT

## 2023-11-29 NOTE — Progress Notes (Signed)
Assisted Dr. Hodierne with right, supraclavicular, ultrasound guided block. Side rails up, monitors on throughout procedure. See vital signs in flow sheet. Tolerated Procedure well. 

## 2023-11-29 NOTE — Discharge Instructions (Addendum)
 [facility] Center Instructions Hand Surgery  Wound Care: Keep your hand elevated above the level of your heart.  Do not allow it to dangle by your side.  Keep the dressing dry and do not remove it unless your doctor advises you to do so.  He will usually change it at the time of your post-op visit.  Moving your fingers is advised to stimulate circulation but will depend on the site of your surgery.  If you have a splint applied, your doctor will advise you regarding movement.  Activity: Do not drive or operate machinery today.  Rest today and then you may return to your normal activity and work as indicated by your physician.  Diet:  Drink liquids today or eat a light diet.  You may resume a regular diet tomorrow.    General expectations: Pain for two to three days. Fingers may become slightly swollen.  Call your doctor if any of the following occur: Severe pain not relieved by pain medication. Elevated temperature. Dressing soaked with blood. Inability to move fingers. White or bluish color to fingers.    Post Anesthesia Home Care Instructions  Activity: Get plenty of rest for the remainder of the day. A responsible individual must stay with you for 24 hours following the procedure.  For the next 24 hours, DO NOT: -Drive a car -Advertising copywriter -Drink alcoholic beverages -Take any medication unless instructed by your physician -Make any legal decisions or sign important papers.  Meals: Start with liquid foods such as gelatin or soup. Progress to regular foods as tolerated. Avoid greasy, spicy, heavy foods. If nausea and/or vomiting occur, drink only clear liquids until the nausea and/or vomiting subsides. Call your physician if vomiting continues.  Special Instructions/Symptoms: Your throat may feel dry or sore from the anesthesia or the breathing tube placed in your throat during surgery. If this causes discomfort, gargle with warm salt water. The discomfort should disappear  within 24 hours.  If you had a scopolamine patch placed behind your ear for the management of post- operative nausea and/or vomiting:  1. The medication in the patch is effective for 72 hours, after which it should be removed.  Wrap patch in a tissue and discard in the trash. Wash hands thoroughly with soap and water. 2. You may remove the patch earlier than 72 hours if you experience unpleasant side effects which may include dry mouth, dizziness or visual disturbances. 3. Avoid touching the patch. Wash your hands with soap and water after contact with the patch.     Regional Anesthesia Blocks  1. You may not be able to move or feel the "blocked" extremity after a regional anesthetic block. This may last may last from 3-48 hours after placement, but it will go away. The length of time depends on the medication injected and your individual response to the medication. As the nerves start to wake up, you may experience tingling as the movement and feeling returns to your extremity. If the numbness and inability to move your extremity has not gone away after 48 hours, please call your surgeon.   2. The extremity that is blocked will need to be protected until the numbness is gone and the strength has returned. Because you cannot feel it, you will need to take extra care to avoid injury. Because it may be weak, you may have difficulty moving it or using it. You may not know what position it is in without looking at it while the block is in  effect.  3. For blocks in the legs and feet, returning to weight bearing and walking needs to be done carefully. You will need to wait until the numbness is entirely gone and the strength has returned. You should be able to move your leg and foot normally before you try and bear weight or walk. You will need someone to be with you when you first try to ensure you do not fall and possibly risk injury.  4. Bruising and tenderness at the needle site are common side  effects and will resolve in a few days.  5. Persistent numbness or new problems with movement should be communicated to the surgeon or the [facility] [phone] [facility] [phone]).

## 2023-11-29 NOTE — Anesthesia Preprocedure Evaluation (Signed)
 Anesthesia Evaluation  Patient identified by MRN, date of birth, ID band Patient awake    Reviewed: Allergy & Precautions, H&P , NPO status , Patient's Chart, lab work & pertinent test results  Airway Mallampati: II   Neck ROM: full    Dental   Pulmonary Current Smoker and Patient abstained from smoking.   breath sounds clear to auscultation       Cardiovascular negative cardio ROS  Rhythm:regular Rate:Normal     Neuro/Psych    GI/Hepatic   Endo/Other    Renal/GU      Musculoskeletal   Abdominal   Peds  Hematology   Anesthesia Other Findings   Reproductive/Obstetrics                              Anesthesia Physical Anesthesia Plan  ASA: 2  Anesthesia Plan: MAC   Post-op Pain Management: Regional block*   Induction: Intravenous  PONV Risk Score and Plan: 1 and Ondansetron , Midazolam  and Propofol  infusion  Airway Management Planned: Simple Face Mask  Additional Equipment:   Intra-op Plan:   Post-operative Plan:   Informed Consent: I have reviewed the patients History and Physical, chart, labs and discussed the procedure including the risks, benefits and alternatives for the proposed anesthesia with the patient or authorized representative who has indicated his/her understanding and acceptance.     Dental advisory given  Plan Discussed with: CRNA, Anesthesiologist and Surgeon  Anesthesia Plan Comments:         Anesthesia Quick Evaluation

## 2023-11-29 NOTE — Op Note (Signed)
 NAME: Raymond Mullen MEDICAL RECORD NO: [id number] DATE OF BIRTH: [birth year] FACILITY: [facility] LOCATION: [facility] PHYSICIAN: Raymond Berkovich R. Casey Fye, MD   OPERATIVE REPORT   DATE OF PROCEDURE: 11/29/23    PREOPERATIVE DIAGNOSIS: Right ring and small finger CMC fracture dislocations   POSTOPERATIVE DIAGNOSIS: Right ring and small finger CMC fracture dislocations   PROCEDURE: 1.  Closed reduction pin fixation of right ring finger CMC fracture dislocation 2.  Closed reduction pin fixation right small finger CMC dislocation   SURGEON:  Raymond Mullen, M.D.   ASSISTANT: none   ANESTHESIA:  Regional with sedation   INTRAVENOUS FLUIDS:  Per anesthesia flow sheet.   ESTIMATED BLOOD LOSS:  Minimal.   COMPLICATIONS:  None.   SPECIMENS:  none   TOURNIQUET TIME: None   DISPOSITION:  Stable to PACU.   INDICATIONS: 23 year old male states he punched a wall 2 weeks ago injuring his right hand.  Was seen in the emergency department where radiographs were taken revealing a fracture of the base of the ring finger metacarpal with dislocation of the CMC joints of the ring and small finger metacarpal.  He wishes to proceed with operative reduction and fixation.  Risks, benefits and alternatives of surgery were discussed including the risks of blood loss, infection, damage to nerves, vessels, tendons, ligaments, bone for surgery, need for additional surgery, complications with wound healing, continued pain, stiffness, , nonunion, malunion.  He voiced understanding of these risks and elected to proceed.  OPERATIVE COURSE:  After being identified preoperatively by myself,  the patient and I agreed on the procedure and site of the procedure.  The surgical site was marked.  Surgical consent had been signed. Preoperative IV antibiotic prophylaxis was given. He was transferred to the operating room and placed on the operating table in supine position with the Right upper extremity on an arm board.   Sedation was induced by the anesthesiologist. A regional block had been performed by anesthesia in preoperative holding.    Right upper extremity was prepped and draped in normal sterile orthopedic fashion.  A surgical pause was performed between the surgeons, anesthesia, and operating room staff and all were in agreement as to the patient, procedure, and site of procedure.  Tourniquet was not inflated.  A close reduction of the ring and small finger CMC fracture dislocation was performed.  C-arm is used in AP lateral oblique projections to ensure appropriate reduction.  0.045 inch K wires were used.  The first was advanced in an oblique fashion across the base of the small finger metacarpal and into the hamate to stabilize the [facility] dislocation of the small finger.  The second was advanced in oblique fashion across the ring finger metacarpal into the long finger metacarpal.  This was adequate to stabilize the fracture dislocation of the ring finger CMC joint.  The third was advanced from the small finger metacarpal into the ring finger metacarpal distal to the fracture site to stabilize the fracture of the ring finger metacarpal and prevent rotation.  C-arm was used in AP lateral oblique projections to ensure appropriate reduction position of hardware as was the case.  The pins were bent and cut short.  The pin sites were dressed with sterile Xeroform 4 x 4's and wrapped with a Kerlix bandage.  A volar and dorsal slab splint including the long ring and small fingers was placed with the MPs flexed and the IP's extended.  This was wrapped with Kerlix and Ace bandage.  Fingertips  were pink with brisk capillary refill at completion of the procedure.  The operative  drapes were broken down.  The patient was awoken from anesthesia safely.  He was transferred back to the stretcher and taken to PACU in stable condition.  I will see him back in the office in 1 week for postoperative followup.  I will give him a prescription  for Norco 5/325 1 tab PO q6 hours prn pain, dispense # 20.   Raymond Linker, MD Electronically signed, 11/29/23

## 2023-11-29 NOTE — Transfer of Care (Signed)
 Immediate Anesthesia Transfer of Care Note  Patient: Raymond Mullen  Procedure(s) Performed: CLOSED REDUCTION RIGHT RING AND SMALL FINGERS WITH PERCUTANEOUS PINNING CARPOMETACARPAL FRACTURE (Right: Finger)  Patient Location: PACU  Anesthesia Type:MAC combined with regional for post-op pain  Level of Consciousness: sedated  Airway & Oxygen Therapy: Patient Spontanous Breathing and Patient connected to face mask oxygen  Post-op Assessment: Report given to RN and Post -op Vital signs reviewed and stable  Post vital signs: Reviewed and stable  Last Vitals:  Vitals Value Taken Time  BP 92/57 11/29/23 11:46  Temp    Pulse 56 11/29/23 11:50  Resp 17 11/29/23 11:50  SpO2 99 % 11/29/23 11:50  Vitals shown include unfiled device data.  Last Pain:  Vitals:   11/29/23 0839  TempSrc: Temporal  PainSc: 0-No pain         Complications: No notable events documented.

## 2023-11-29 NOTE — H&P (Signed)
  Raymond Mullen is an 23 y.o. male.   Chief Complaint: fracture/dislocation HPI: 23 yo male states he punched a wall 11/18/23 injuring right hand.  Seen in ED where XR revealed ring and small finger cmc fracture/dislocation.  He wishes to proceed with surgical reduction and fixation.  Allergies: No Known Allergies  History reviewed. No pertinent past medical history.  Past Surgical History:  Procedure Laterality Date   ORIF ANKLE FRACTURE Left 11/09/2016   Procedure: OPEN REDUCTION INTERNAL FIXATION (ORIF) ANKLE FRACTURE;  Surgeon: Beverley Evalene BIRCH, MD;  Location: [facility];  Service: Orthopedics;  Laterality: Left;    Family History: History reviewed. No pertinent family history.  Social History:   reports that he has been smoking. He has never used smokeless tobacco. He reports that he does not currently use alcohol. He reports current drug use. Drug: Marijuana.  Medications: Medications Prior to Admission  Medication Sig Dispense Refill   HYDROcodone -acetaminophen  (NORCO/VICODIN) 5-325 MG tablet Take 1 tablet by mouth every 6 (six) hours as needed. 10 tablet 0    No results found for this or any previous visit (from the past 48 hours).  No results found.    Height 5' 8 (1.727 m), weight 58 kg.  General appearance: alert, cooperative, and appears stated age Head: Normocephalic, without obvious abnormality, atraumatic Neck: supple, symmetrical, trachea midline Extremities: Intact sensation and capillary refill all digits.  +epl/fpl/io.  No wounds.  Skin: Skin color, texture, turgor normal. No rashes or lesions Neurologic: Grossly normal Incision/Wound: none  Assessment/Plan Right ring and small finger cmc fracture/dislocation.  Non operative and operative treatment options have been discussed with the patient and patient wishes to proceed with operative treatment. Risks, benefits, and alternatives of surgery have been discussed and the patient agrees with  the plan of care.   Merion Caton 11/29/2023, 8:34 AM

## 2023-11-30 NOTE — Anesthesia Postprocedure Evaluation (Signed)
 Anesthesia Post Note  Patient: Raymond Mullen  Procedure(s) Performed: CLOSED REDUCTION RIGHT RING AND SMALL FINGERS WITH PERCUTANEOUS PINNING CARPOMETACARPAL FRACTURE (Right: Finger)     Patient location during evaluation: PACU Anesthesia Type: MAC and Regional Level of consciousness: awake and alert Pain management: pain level controlled Vital Signs Assessment: post-procedure vital signs reviewed and stable Respiratory status: spontaneous breathing, nonlabored ventilation, respiratory function stable and patient connected to nasal cannula oxygen Cardiovascular status: stable and blood pressure returned to baseline Postop Assessment: no apparent nausea or vomiting Anesthetic complications: no   No notable events documented.  Last Vitals:  Vitals:   11/29/23 1224 11/29/23 1233  BP:  (!) 108/90  Pulse: (!) 57 (!) 58  Resp: 11 18  Temp:  (!) 36.2 C  SpO2: 100% 99%    Last Pain:  Vitals:   11/29/23 1233  TempSrc: Temporal  PainSc: 0-No pain                 Ulyana Pitones S

## 2023-12-03 ENCOUNTER — Encounter (HOSPITAL_BASED_OUTPATIENT_CLINIC_OR_DEPARTMENT_OTHER): Payer: Self-pay | Admitting: Orthopedic Surgery
